# Patient Record
Sex: Male | Born: 1990 | Race: White | Hispanic: No | Marital: Single | Smoking: Never smoker
Health system: Southern US, Community
[De-identification: ages and names within clinical notes are randomized; demographics above are authoritative.]

---
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
## 2010-02-12 ENCOUNTER — Ambulatory Visit: Payer: Self-pay | Admitting: Vascular Surgery

## 2010-02-14 LAB — PATHOLOGY REPORT

## 2012-07-18 ENCOUNTER — Ambulatory Visit: Payer: Self-pay | Admitting: Emergency Medicine

## 2012-08-03 ENCOUNTER — Ambulatory Visit: Payer: Self-pay | Admitting: Orthopedic Surgery

## 2013-10-23 IMAGING — CR DG KNEE COMPLETE 4+V*L*
1 series · 4 of 4 positions shown · non-contrast
Comparison: none

REASON FOR EXAM: L knee pain due to injury
COMMENTS:

PROCEDURE:     MDR - MDR KNEE LT COMPLETE W/OBLIQUES  - July 18, 2012 [DATE]
RESULT:     Comparison: None.

[Series 1: ap · 0.17mm/px · 4 of 4 slices shown]
[im 1/4]
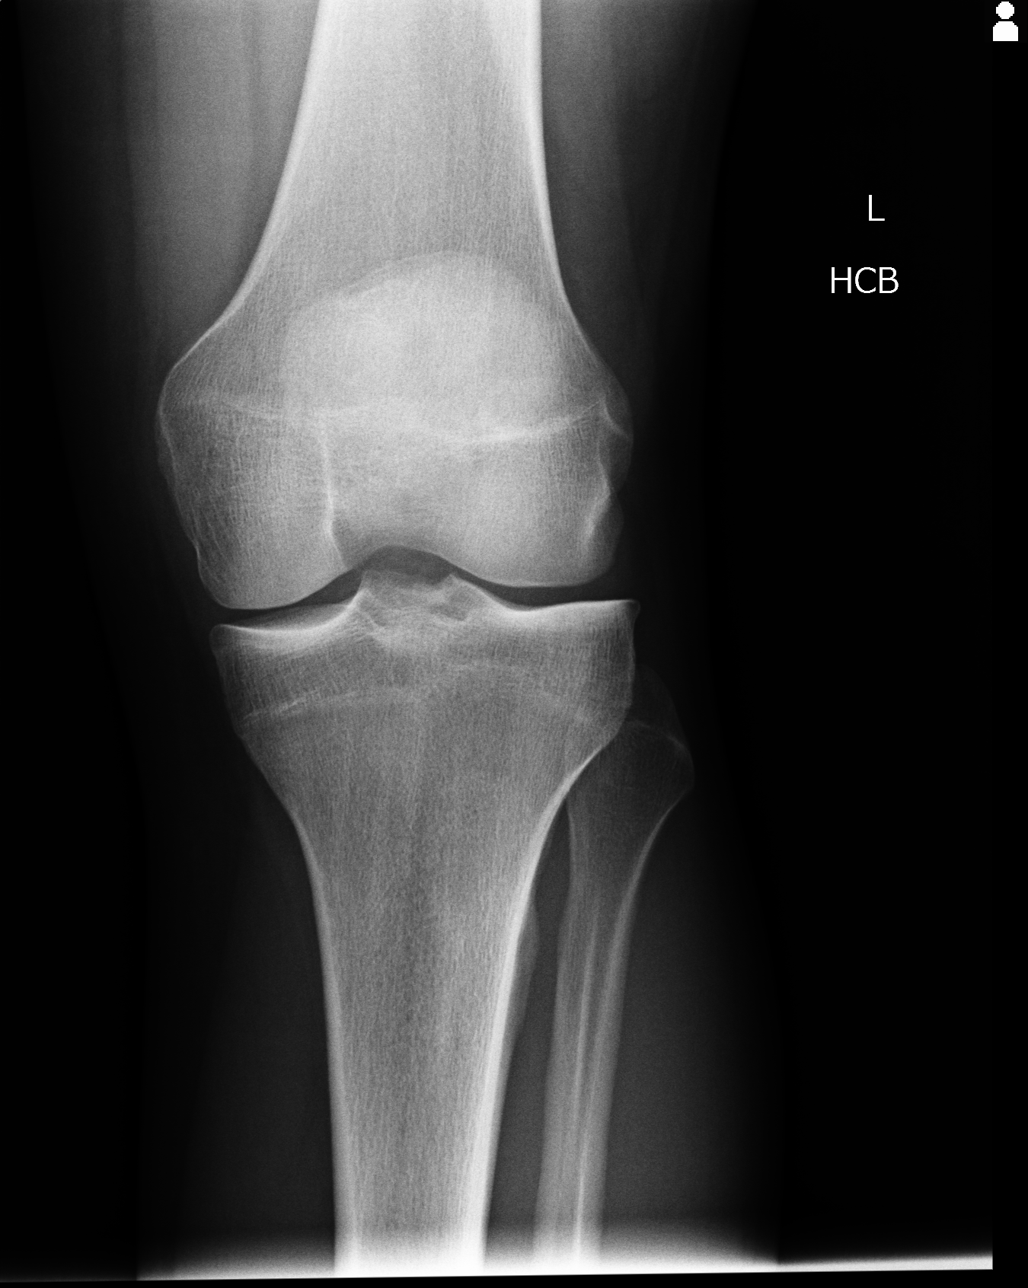
[im 2/4]
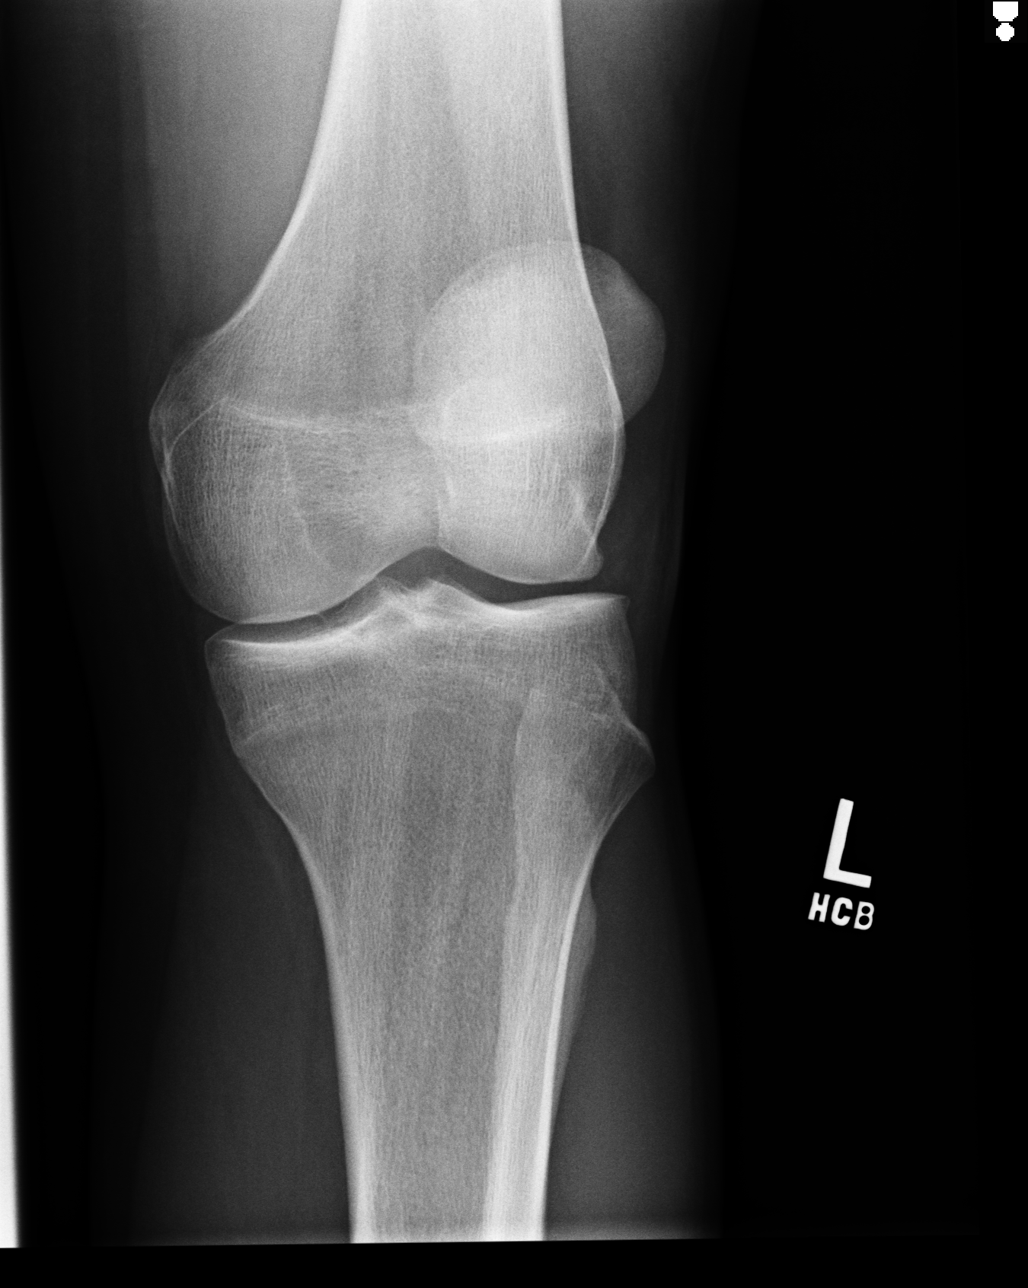
[im 3/4]
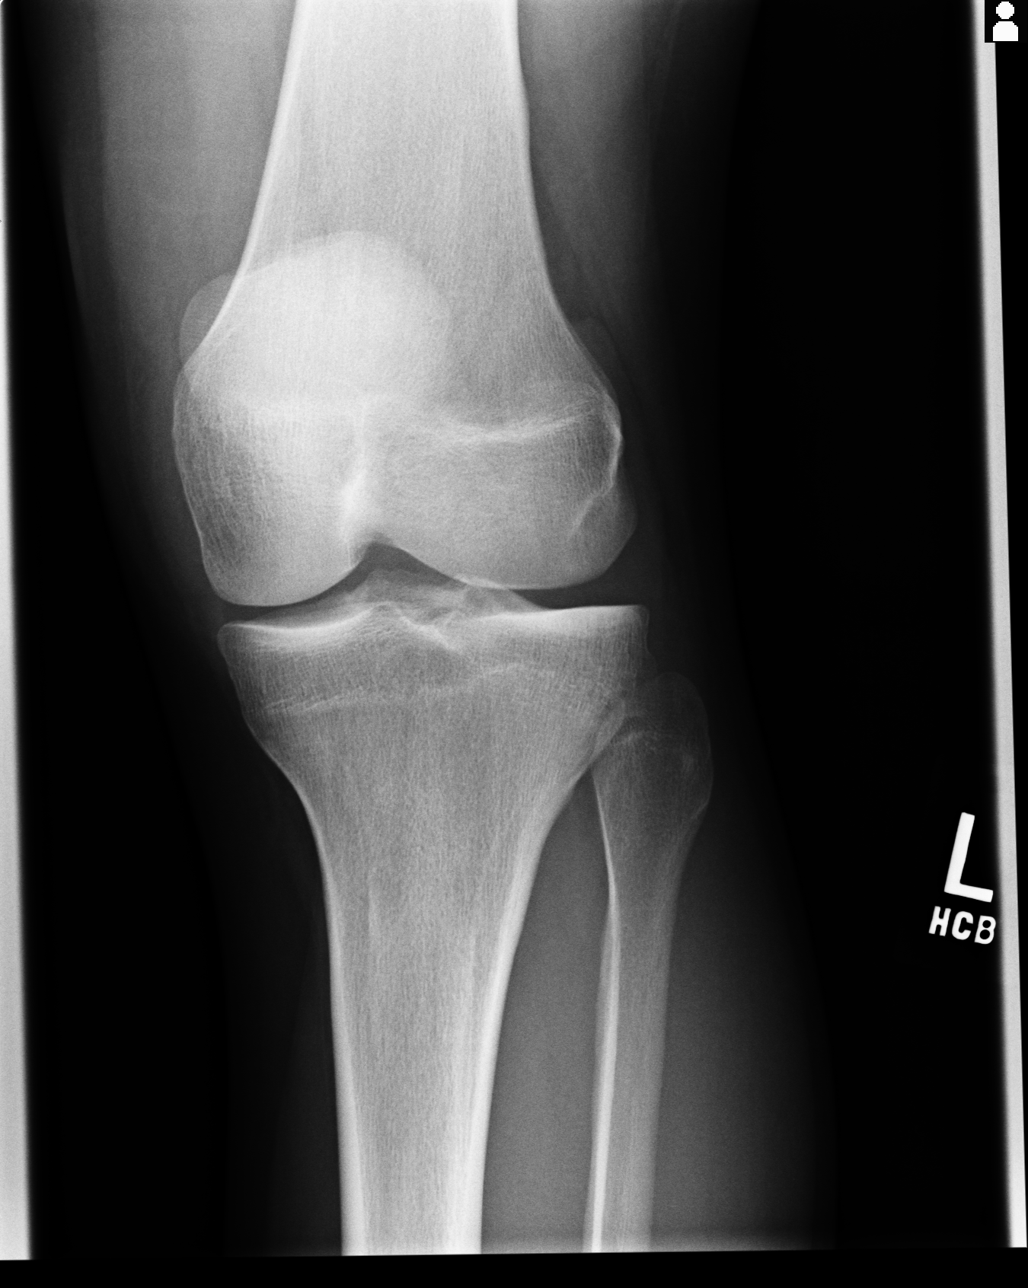
[im 4/4]
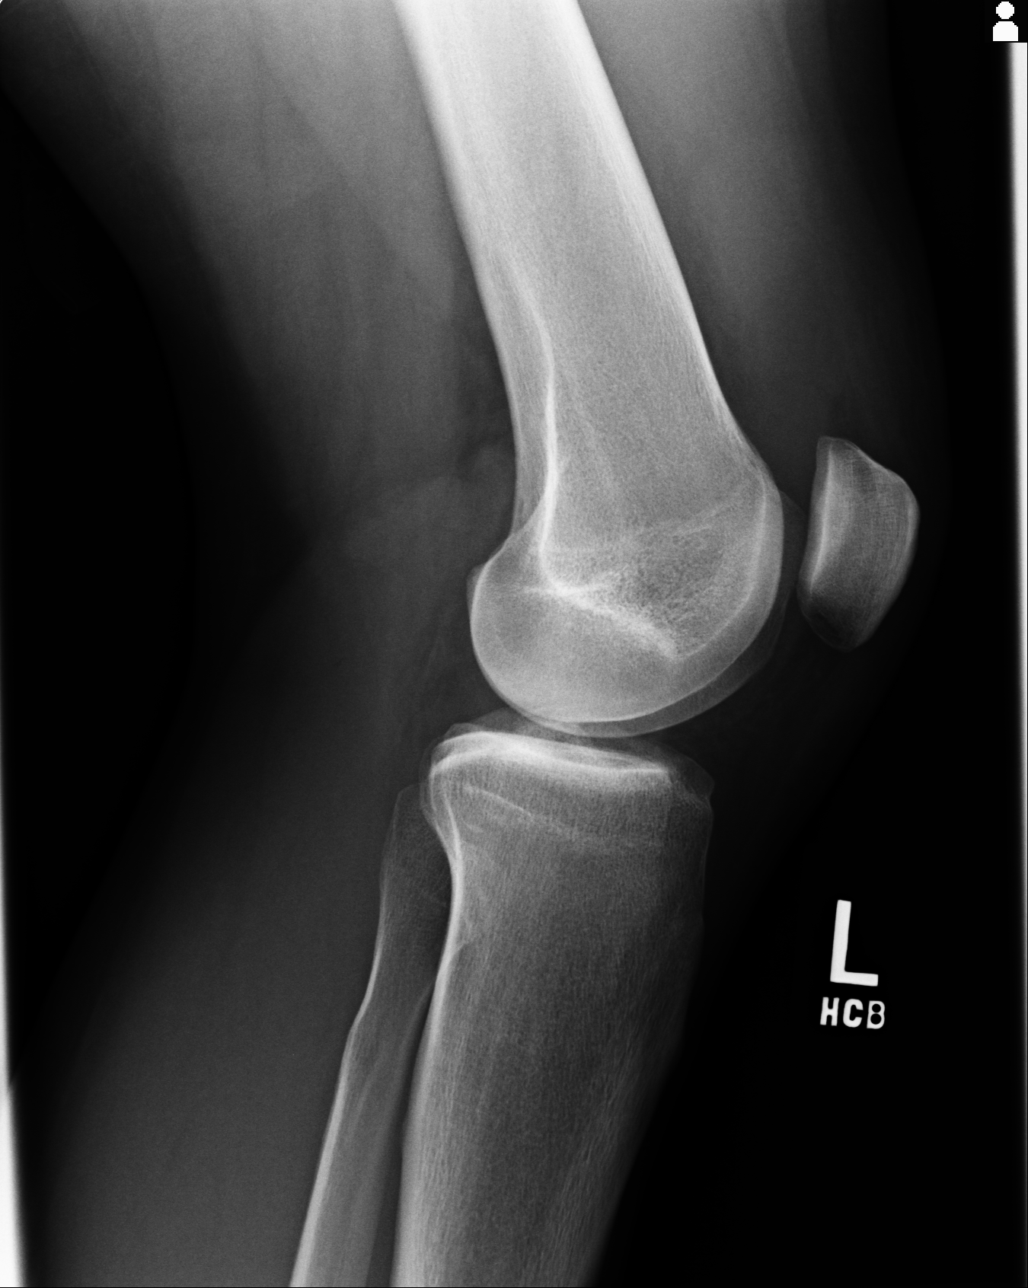

[4 of 4 positions shown; findings below may reference images not displayed]

FINDINGS: No acute fracture. Normal alignment. Moderate sized knee effusion.
IMPRESSION: Moderate sized knee effusion.

[REDACTED]

## 2015-04-16 ENCOUNTER — Encounter: Payer: Self-pay | Admitting: Emergency Medicine

## 2015-04-16 ENCOUNTER — Ambulatory Visit
Admission: EM | Admit: 2015-04-16 | Discharge: 2015-04-16 | Disposition: A | Discharge status: Home / Self Care | Payer: Self-pay | Attending: Family Medicine | Admitting: Family Medicine

## 2015-04-16 DIAGNOSIS — J029 Acute pharyngitis, unspecified: Secondary | ICD-10-CM

## 2015-04-16 LAB — RAPID STREP SCREEN (MED CTR MEBANE ONLY): Streptococcus, Group A Screen (Direct): NEGATIVE

## 2015-04-16 MED ORDER — LIDOCAINE VISCOUS 2 % MT SOLN: 20.0000 mL | Freq: Four times a day (QID) | OROMUCOSAL | Status: AC | PRN

## 2015-04-16 MED ORDER — PENICILLIN V POTASSIUM 500 MG PO TABS: 500.0000 mg | ORAL_TABLET | Freq: Three times a day (TID) | ORAL | Status: AC

## 2015-04-16 NOTE — ED Provider Notes (Signed)
CSN: [id number]     Arrival date & time 04/16/15  1478 History   First MD Initiated Contact with Patient 04/16/15 [phone]     Chief Complaint  Patient presents with  . Sore Throat   (Consider location/radiation/quality/duration/timing/severity/associated sxs/prior Treatment) Patient is a 25 y.o. male presenting with pharyngitis. The history is provided by the patient.  Sore Throat This is a new problem. The current episode started more than 2 days ago. The problem occurs constantly. The problem has been gradually worsening. Pertinent negatives include no chest pain, no headaches and no shortness of breath.    History reviewed. No pertinent past medical history. History reviewed. No pertinent past surgical history. History reviewed. No pertinent family history. Social History  Substance Use Topics  . Smoking status: Never Smoker   . Smokeless tobacco: Never Used  . Alcohol Use: No    Review of Systems  Respiratory: Negative for shortness of breath.   Cardiovascular: Negative for chest pain.  Neurological: Negative for headaches.    Allergies  Review of patient's allergies indicates no known allergies.  Home Medications   Prior to Admission medications   Medication Sig Start Date End Date Taking? Authorizing Provider  lidocaine (XYLOCAINE) 2 % solution Use as directed 20 mLs in the mouth or throat every 6 (six) hours as needed. Sore throat; gargle and spit 04/16/15   Payton Mccallum, MD  penicillin v potassium (VEETID) 500 MG tablet Take 1 tablet (500 mg total) by mouth 3 (three) times daily. 04/16/15   Payton Mccallum, MD   Meds Ordered and Administered this Visit  Medications - No data to display  BP 148/90 mmHg  Pulse 85  Temp(Src) 98.2 F (36.8 C) (Oral)  Resp 16  Ht 6' (1.829 m)  Wt 200 lb (90.719 kg)  BMI 27.12 kg/m2  SpO2 99% No data found.   Physical Exam  Constitutional: He appears well-developed and well-nourished. No distress.  HENT:  Head: Normocephalic and  atraumatic.  Right Ear: Tympanic membrane, external ear and ear canal normal.  Left Ear: Tympanic membrane, external ear and ear canal normal.  Nose: Nose normal.  Mouth/Throat: Uvula is midline and mucous membranes are normal. Oropharyngeal exudate and posterior oropharyngeal erythema present. No tonsillar abscesses.  Eyes: Conjunctivae and EOM are normal. Pupils are equal, round, and reactive to light. Right eye exhibits no discharge. Left eye exhibits no discharge. No scleral icterus.  Neck: Normal range of motion. Neck supple. No tracheal deviation present. No thyromegaly present.  Cardiovascular: Normal rate, regular rhythm and normal heart sounds.   Pulmonary/Chest: Effort normal and breath sounds normal. No stridor. No respiratory distress. He has no wheezes. He has no rales. He exhibits no tenderness.  Lymphadenopathy:    He has cervical adenopathy.  Neurological: He is alert.  Skin: Skin is warm and dry. No rash noted. He is not diaphoretic.  Nursing note and vitals reviewed.   ED Course  Procedures (including critical care time)  Labs Review Labs Reviewed  RAPID STREP SCREEN (NOT AT [facility])  CULTURE, GROUP A STREP [facility])    Imaging Review No results found.   Visual Acuity Review  Right Eye Distance:   Left Eye Distance:   Bilateral Distance:    Right Eye Near:   Left Eye Near:    Bilateral Near:         MDM   1. Pharyngitis   (likely strep based on clinical presentation)   New Prescriptions   LIDOCAINE (XYLOCAINE) 2 % SOLUTION  Use as directed 20 mLs in the mouth or throat every 6 (six) hours as needed. Sore throat; gargle and spit   PENICILLIN V POTASSIUM (VEETID) 500 MG TABLET    Take 1 tablet (500 mg total) by mouth 3 (three) times daily.   1. diagnosis reviewed with patient 2. rx as per orders above; reviewed possible side effects, interactions, risks and benefits  3. Recommend supportive treatment with otc analgesics, increased fluids 4.  Follow-up prn if symptoms worsen or don't improve    Payton Mccallum, MD 04/16/15 [phone]

## 2015-04-16 NOTE — ED Notes (Signed)
Patient c/o sore throat for the past two days.

## 2015-04-17 LAB — CULTURE, GROUP A STREP (THRC)
